# Patient Record
Sex: Male | Born: 2005
Health system: Southern US, Community
[De-identification: ages and names within clinical notes are randomized; demographics above are authoritative.]

## PROBLEM LIST (undated history)

## (undated) DIAGNOSIS — K219 Gastro-esophageal reflux disease without esophagitis: Secondary | ICD-10-CM

## (undated) DIAGNOSIS — J45909 Unspecified asthma, uncomplicated: Secondary | ICD-10-CM

## (undated) DIAGNOSIS — R55 Syncope and collapse: Secondary | ICD-10-CM

## (undated) DIAGNOSIS — Z9889 Other specified postprocedural states: Secondary | ICD-10-CM

## (undated) DIAGNOSIS — R519 Headache, unspecified: Secondary | ICD-10-CM

## (undated) DIAGNOSIS — R51 Headache: Secondary | ICD-10-CM

## (undated) HISTORY — DX: Headache: R51

## (undated) HISTORY — DX: Other specified postprocedural states: Z98.890

## (undated) HISTORY — DX: Unspecified asthma, uncomplicated: J45.909

## (undated) HISTORY — PX: CIRCUMCISION REVISION: SHX1347

## (undated) HISTORY — DX: Headache, unspecified: R51.9

## (undated) HISTORY — PX: CIRCUMCISION: SUR203

## (undated) HISTORY — PX: OTHER SURGICAL HISTORY: SHX169

## (undated) HISTORY — DX: Gastro-esophageal reflux disease without esophagitis: K21.9

## (undated) HISTORY — DX: Syncope and collapse: R55

---
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
## 2025-08-30 ENCOUNTER — Encounter: Admitting: Family Medicine
